# Patient Record
Sex: Female | Born: 1950
Health system: Southern US, Community
[De-identification: ages and names within clinical notes are randomized; demographics above are authoritative.]

## PROBLEM LIST (undated history)

## (undated) DIAGNOSIS — R519 Headache, unspecified: Secondary | ICD-10-CM

## (undated) DIAGNOSIS — J45909 Unspecified asthma, uncomplicated: Secondary | ICD-10-CM

## (undated) DIAGNOSIS — M810 Age-related osteoporosis without current pathological fracture: Secondary | ICD-10-CM

## (undated) DIAGNOSIS — E079 Disorder of thyroid, unspecified: Secondary | ICD-10-CM

## (undated) DIAGNOSIS — F419 Anxiety disorder, unspecified: Secondary | ICD-10-CM

## (undated) DIAGNOSIS — I4891 Unspecified atrial fibrillation: Secondary | ICD-10-CM

## (undated) DIAGNOSIS — M1990 Unspecified osteoarthritis, unspecified site: Secondary | ICD-10-CM

## (undated) DIAGNOSIS — C801 Malignant (primary) neoplasm, unspecified: Secondary | ICD-10-CM

## (undated) DIAGNOSIS — F5101 Primary insomnia: Secondary | ICD-10-CM

## (undated) DIAGNOSIS — K5900 Constipation, unspecified: Secondary | ICD-10-CM

## (undated) DIAGNOSIS — F32A Depression, unspecified: Secondary | ICD-10-CM

## (undated) HISTORY — DX: Malignant (primary) neoplasm, unspecified: C80.1

## (undated) HISTORY — DX: Constipation, unspecified: K59.00

## (undated) HISTORY — DX: Unspecified atrial fibrillation: I48.91

## (undated) HISTORY — DX: Disorder of thyroid, unspecified: E07.9

## (undated) HISTORY — DX: Age-related osteoporosis without current pathological fracture: M81.0

## (undated) HISTORY — DX: Primary insomnia: F51.01

## (undated) HISTORY — DX: Unspecified osteoarthritis, unspecified site: M19.90

## (undated) HISTORY — PX: PR ANES; REPAIR OF RECTOCELE: AN45560

## (undated) HISTORY — DX: Unspecified asthma, uncomplicated: J45.909

## (undated) HISTORY — DX: Headache, unspecified: R51.9

## (undated) HISTORY — DX: Depression, unspecified: F32.A

## (undated) HISTORY — DX: Anxiety disorder, unspecified: F41.9

---

## 2017-04-30 ENCOUNTER — Encounter (INDEPENDENT_AMBULATORY_CARE_PROVIDER_SITE_OTHER): Payer: Self-pay | Admitting: Urology

## 2017-04-30 ENCOUNTER — Ambulatory Visit (INDEPENDENT_AMBULATORY_CARE_PROVIDER_SITE_OTHER): Payer: Medicare HMO | Admitting: Urology

## 2017-04-30 VITALS — BP 120/76 | Ht 64.0 in | Wt 240.0 lb

## 2017-04-30 DIAGNOSIS — Z6841 Body Mass Index (BMI) 40.0 and over, adult: Secondary | ICD-10-CM

## 2017-04-30 DIAGNOSIS — N952 Postmenopausal atrophic vaginitis: Secondary | ICD-10-CM

## 2017-04-30 DIAGNOSIS — R351 Nocturia: Secondary | ICD-10-CM | POA: Insufficient documentation

## 2017-04-30 DIAGNOSIS — K649 Unspecified hemorrhoids: Secondary | ICD-10-CM | POA: Insufficient documentation

## 2017-04-30 DIAGNOSIS — N3941 Urge incontinence: Secondary | ICD-10-CM

## 2017-04-30 DIAGNOSIS — R3915 Urgency of urination: Secondary | ICD-10-CM

## 2017-04-30 LAB — PR U/A NONAUTO W/MICRO, ONSITE
Specific Gravity, URN: 1.03 (ref 1.005–1.030)
pH, URN (UWNC): 5 (ref 5.0–8.0)

## 2017-04-30 LAB — PR MEAS POST-VOIDING RESIDUAL URINE&/BLADDER CAP

## 2017-04-30 MED ORDER — ESTRADIOL 10 MCG VA TABS
10.0000 ug | ORAL_TABLET | VAGINAL | 12 refills | Status: AC
Start: 2017-05-01 — End: ?

## 2017-04-30 NOTE — Patient Instructions (Addendum)
1.  Handout given for dietary modification for low acid diet  2.  Kegel program: Slow twitch fibers: Squeeze and hold 5 seconds, relax for 10 seconds, this is one rep, do to 10-20 reps 3-4 times per week.  Fast twitch fibers: Squeeze 6 or 8 times quickly, then relax for 10 seconds, this is one rep, do 5-10 reps 3 or 4 times per week  3.  Prescription sent for estrogen vaginal suppositories to be used twice weekly  4.  Consider seeing Dr. Jacqlyn Krauss or Dr. Wendie Agreste at [facility] general surgery for evaluation of hemorrhoids  5.  Follow-up in 3 months for reevaluation

## 2017-04-30 NOTE — Progress Notes (Signed)
[place], [place]  [email or web address]  _________________________________________________________    Patient: Mary Camacho  DOB: July 09, 1950 (66 year old)  Date of Visit: 04/30/2017    CC:  Incontinence     SUBJECTIVE:  Patient's old records are unavailable, but she reports that in 1999 she had a bladder neck suspension and an anterior posterior repair by myself.  In January 2000 she had repeat surgery for removal of the stitch.    For about 5 years she has required splinting her perineum to have a bowel movement.  This year she has noted a vaginal bulge and occasional episodes of bowel urgency.  She has a stinging and cracking sensation around her anus that significantly worsens with urination.  She had an antibiotic for a bladder infection which slightly help the symptoms of anal itching but they returned.    Current symptoms include significant urinary urgency incontinence, frequency every 2 hours and nocturia 3-4 times per night.  She has only mild stress incontinence.  She is status post hysterectomy.      I reviewed the patient's recorded medical history, and confirmed the medications, problem list, allergies, past medical history, past surgical history, social history and family history with the patient.     REVIEW OF SYSTEMS:  A 14 system ROS was reviewed and scanned into the chart, see Media tab. Positive for sexual problems, dysuria, incontinence requiring 2 pads, urgency, frequency, nocturia 3-4, vaginal itching, constipation, trouble swallowing, rectal stinging and bleeding, weight gain about 30-50 pounds a year, fatigue, irregular heartbeat with SVT/A. fib, cold intolerance, dry eyes, hearing loss, sleep apnea, shortness of breath, asthma, arthritis, joint pain, headaches, tremor, anxiety, depression, sleep problems and all other systems negative    OBJECTIVE:  Vitals: BP 120/76    Ht 5\' 4"  (1.626 m)    Wt (!) 240 lb (108.9 kg)     BMI 41.20 kg/m   General: healthy, alert, overweight, no distress  Abdomen:  soft, nontender, without masses, hernias, inguinal adenopathy, SP tenderness.  GU:   Vaginal Exam  External genitalia is moderately atrophic. Valsalva incontinence was not present. Urethral tenderness was not present. Urethral hypermobility was present. Vaginal masses were not present. Vaginal lesions were not present. Vaginal discharge was not present.    Vaginal length:  8 cm with 1 cm vault prolapse and 1 cm anterior wall prolapse with straining    Cystocele: grade I  Rectocele: none  Enterocele: grade I  Uterine prolapse: surgically absent  Bladder: normal    Pelvic floor:   Levators: normal. Patient with weak Kegel.    Rectal exam:  Anal tone normal. Masses were not appreciated. There was no blood. There were hemorrhoids.  Rectocele: small   Anal sphincter attenuation: moderate      ASSESSMENT:  (N39.41) Urge incontinence  (primary encounter diagnosis)  Plan: U/A NONAUTO W/MICRO, ONSITE, MEAS POST-VOIDING         RESIDUAL URINE&/BLADDER CAP        Patient was reassured that her bladder emptied well and she had no evidence of urinary tract infection.  She does not have much rectal prolapse and has only mild anterior wall and vaginal apex prolapse, not enough to consider surgery.  For her overactive bladder symptoms, discussed oral medication, topical vaginal estrogen, dietary modification and Kegel exercises.  Patient would like to try everything except oral medication.  Handout given for low acid diet and discussed.  Kegel program written.  Follow-up in 3 months for reevaluation    (R39.15) Urinary urgency      (R35.1) Nocturia      (N95.2) Atrophic vaginitis  Plan: Estradiol 10 MCG Vaginal Tab        Patient opted for Vagifem for topical vaginal estrogen.  Side effects discussed, prescription sent.      (K64.9) Hemorrhoids, unspecified hemorrhoid type  Plan: Patient's hemorrhoids may be the etiology of the itching around her anus,  have suggested that she follow up with a colorectal surgeon or general surgeon for evaluation.            Irven Easterly, MD

## 2017-05-07 ENCOUNTER — Encounter (INDEPENDENT_AMBULATORY_CARE_PROVIDER_SITE_OTHER): Payer: Medicare HMO | Admitting: Urology

## 2017-05-08 ENCOUNTER — Telehealth (INDEPENDENT_AMBULATORY_CARE_PROVIDER_SITE_OTHER): Payer: Self-pay | Admitting: Urology

## 2017-05-08 NOTE — Telephone Encounter (Signed)
Mary Camacho called to follow up on from her 12/19 appointment. She feels that there were two issues that need to be addressed further, and would also like to note that it was because she may not have communicated these issues properly. She would like to note that she is very satisfied with SW's care, she is just seeking answers to the following issues:    1) Mary Camacho had a UTI a while back, but she is still experiencing pain in the tube when she urine is coming down. She reports that this was discussed at her visit, but she would like to know what else to do.    2) Mary Camacho has a skin irritation in the pubic hair on the left side. She would like to know what to do about it.

## 2017-05-08 NOTE — Telephone Encounter (Signed)
Please call patient regarding estrogen cream, she was given the vaginal tabs, states she needs to put the cream on sore in vaginal area, I did read her the note where hydrocortisone was suggested.  Please advise patient on Friday 05/09/2017 in the morning

## 2017-05-08 NOTE — Telephone Encounter (Signed)
This RN returned patients call re: 2 follow up questions after her 04/30/17 appointment with SW. Advised patient to start Vagifem estrogen cream and see if that helps resolve the irritation she is experiencing with urination. For the skin irritation in her pubic area, advised topical hyrdrocortisone cream. Patient will call back as needed to follow up with status of these issues. Florina Ou, RN

## 2017-05-09 NOTE — Telephone Encounter (Signed)
Reassured patient that Estradiol Vaginal tabs will help decrease burning and itching as well. Patient satisfied with clarification. Florina Ou, RN

## 2017-07-30 ENCOUNTER — Encounter (INDEPENDENT_AMBULATORY_CARE_PROVIDER_SITE_OTHER): Payer: Medicare HMO | Admitting: Urology

## 2017-08-13 ENCOUNTER — Ambulatory Visit (INDEPENDENT_AMBULATORY_CARE_PROVIDER_SITE_OTHER): Payer: Medicare HMO | Admitting: Urology

## 2017-08-13 VITALS — BP 108/60 | Ht 64.0 in | Wt 232.0 lb

## 2017-08-13 DIAGNOSIS — Z6839 Body mass index (BMI) 39.0-39.9, adult: Secondary | ICD-10-CM

## 2017-08-13 DIAGNOSIS — R3915 Urgency of urination: Secondary | ICD-10-CM

## 2017-08-13 DIAGNOSIS — N952 Postmenopausal atrophic vaginitis: Secondary | ICD-10-CM

## 2017-08-13 DIAGNOSIS — R350 Frequency of micturition: Secondary | ICD-10-CM | POA: Insufficient documentation

## 2017-08-13 DIAGNOSIS — R351 Nocturia: Secondary | ICD-10-CM

## 2017-08-13 DIAGNOSIS — N3941 Urge incontinence: Secondary | ICD-10-CM

## 2017-08-13 NOTE — Progress Notes (Signed)
[place], [place]  [email or web address]  _________________________________________________________    Patient: Mary Camacho  DOB: [birth year] (67 year old)  Date of Visit: 08/13/2017    CC:  Incontinence     SUBJECTIVE:  she reports that in 1999 she had a bladder neck suspension and an anterior posterior repair by myself.  In January 2000 she had repeat surgery for removal of the stitch.    For about 5 years she has required splinting her perineum to have a bowel movement.  This year she has noted a vaginal bulge and occasional episodes of bowel urgency.  She has a stinging and cracking sensation around her anus that significantly worsens with urination.  She was found to have moderate hemorrhoids and follow-up was recommended with a general or colorectal surgeon.    On initial evaluation in December 2018 patient's exam showed minimal rectocele and only mild anterior wall and vault prolapse.  She did not have a UTI and her bladder emptied well.  She had symptoms of urgency, frequency, nocturia and urge incontinence.    For her overactive bladder symptoms she opted for topical vaginal estrogen, dietary modification and Kegel exercises.    Now: Patient reports that the vaginal sores improved with estrogen.  She was then hospitalized in January for congestive heart failure and did not use the estrogen for a while.  The vaginal sores recurred but have improved since she began using the vaginal estrogen again.  Kegel exercises did not help her symptoms.  She did not try dietary modification.    Since she was hospitalized she was started on 2 diuretics which have made her bladder symptoms much worse with urinary urgency, frequency and urge incontinence.  She currently wears 2 pads daily for the problem.        I reviewed the patient's recorded medical history, and confirmed the medications, problem list, allergies and past medical history with the  patient.     REVIEW OF SYSTEMS:  A 14 system ROS was reviewed and scanned into the chart, see Media tab.  Positive for incontinence requiring 2 pads daily, urgency and frequency due to diuretics, irregular heartbeat, dry eyes, hearing loss, varicose veins, mild easy bruising, shortness of breath, asthma, chronic pain, muscle weakness, osteoarthritis, muscle cramping, joint pain, headaches, tremor, anxiety, depression, sexual problems, sleep problems and all other systems negative.    OBJECTIVE:  Vitals: BP 108/60    Ht 5\' 4"  (1.626 m)    Wt (!) 232 lb (105.2 kg)    BMI 39.82 kg/m   General: alert, chronically ill appearing, overweight, no distress      ASSESSMENT:  (N39.41) Urge incontinence  (primary encounter diagnosis)  Plan: Discussed options for OAB for patient, she is not willing to try further medication at this point but did not try dietary modification and would like to have another copy of the low acid diet.  Handout given and discussed.  In the future, patient could consider anticholinergic medication if she wishes.  Follow-up as needed for further urologic problems.    (R35.1) Nocturia      (R39.15) Urinary urgency      (R35.0) Frequency of micturition      (N95.2) Atrophic vaginitis  Plan: Have encouraged patient to continue using topical vaginal estrogen indefinitely.            Irven Easterly, MD

## 2017-08-13 NOTE — Patient Instructions (Signed)
1.  Handout given for dietary modification for low acid diet  2.  Continue topical vaginal estrogen indefinitely, check to see if your primary care physician will refill this prescription in the future.  Current prescription is good until December 2019  4.  Consider seeing Dr. Jacqlyn Krauss or Dr. Wendie Agreste at [facility] general surgery for evaluation of hemorrhoids  5.  Follow-up as needed for further urologic problems

## 2017-10-17 ENCOUNTER — Telehealth (INDEPENDENT_AMBULATORY_CARE_PROVIDER_SITE_OTHER): Payer: Self-pay | Admitting: Urology

## 2017-10-17 NOTE — Telephone Encounter (Signed)
Mary Camacho called in regards to potentially seeking a referral for a "sore in vaginal skin area where the skin is getting very thin."    She states that SW had prescribed Estradiol to make the skin thicker, but it has not been making it thicker, it is still very thin and very sore. Mary Camacho would not like to make an appointment with SW for follow up from this Rx as she does not see the point unless something new will be done.    Mary Camacho is curious if SW has a recommendation for a referral to another provider who could provide further treatment for her.

## 2017-10-20 NOTE — Telephone Encounter (Signed)
She could consider seeing a gynecologist or a dermatologist for the symptoms.

## 2017-10-20 NOTE — Telephone Encounter (Signed)
I called Mary Camacho back and let her know. She has a dermatology already and she will call him and see if he does that. Or her or her PCP can refer her to a gynecologist.

## 2017-11-05 ENCOUNTER — Telehealth (INDEPENDENT_AMBULATORY_CARE_PROVIDER_SITE_OTHER): Payer: Self-pay | Admitting: Urology

## 2017-11-05 NOTE — Telephone Encounter (Signed)
Faxed visit notes from Epic (2 visit notes) to [facility] per STAT request received 6/26.    Fax: [phone]

## 2017-11-23 ENCOUNTER — Inpatient Hospital Stay: Payer: Self-pay

## 2017-11-25 ENCOUNTER — Inpatient Hospital Stay: Payer: Self-pay

## 2017-12-04 ENCOUNTER — Emergency Department (HOSPITAL_COMMUNITY): Payer: Medicare Other

## 2017-12-04 ENCOUNTER — Emergency Department (HOSPITAL_COMMUNITY)
Admission: EM | Admit: 2017-12-04 | Discharge: 2017-12-04 | Disposition: A | Discharge status: Home / Self Care | Payer: Medicare Other | Attending: Emergency Medicine | Admitting: Emergency Medicine

## 2017-12-04 DIAGNOSIS — M25551 Pain in right hip: Secondary | ICD-10-CM | POA: Diagnosis not present

## 2017-12-04 DIAGNOSIS — M791 Myalgia, unspecified site: Secondary | ICD-10-CM | POA: Diagnosis not present

## 2017-12-04 DIAGNOSIS — W010XXA Fall on same level from slipping, tripping and stumbling without subsequent striking against object, initial encounter: Secondary | ICD-10-CM | POA: Insufficient documentation

## 2017-12-04 DIAGNOSIS — Z79899 Other long term (current) drug therapy: Secondary | ICD-10-CM | POA: Insufficient documentation

## 2017-12-04 DIAGNOSIS — Y999 Unspecified external cause status: Secondary | ICD-10-CM | POA: Insufficient documentation

## 2017-12-04 DIAGNOSIS — R51 Headache: Secondary | ICD-10-CM | POA: Diagnosis not present

## 2017-12-04 DIAGNOSIS — Z7901 Long term (current) use of anticoagulants: Secondary | ICD-10-CM | POA: Insufficient documentation

## 2017-12-04 DIAGNOSIS — Y9319 Activity, other involving water and watercraft: Secondary | ICD-10-CM | POA: Diagnosis not present

## 2017-12-04 DIAGNOSIS — M62838 Other muscle spasm: Secondary | ICD-10-CM | POA: Diagnosis not present

## 2017-12-04 DIAGNOSIS — I10 Essential (primary) hypertension: Secondary | ICD-10-CM | POA: Diagnosis not present

## 2017-12-04 DIAGNOSIS — W19XXXA Unspecified fall, initial encounter: Secondary | ICD-10-CM

## 2017-12-04 DIAGNOSIS — Y9289 Other specified places as the place of occurrence of the external cause: Secondary | ICD-10-CM | POA: Insufficient documentation

## 2017-12-04 LAB — BASIC METABOLIC PANEL
ANION GAP: 14 (ref 5–15)
BUN: 23 mg/dL (ref 8–23)
CO2: 31 mmol/L (ref 22–32)
Calcium: 9.8 mg/dL (ref 8.9–10.3)
Chloride: 96 mmol/L — ABNORMAL LOW (ref 98–111)
Creatinine, Ser: 1.18 mg/dL — ABNORMAL HIGH (ref 0.44–1.00)
GFR, EST AFRICAN AMERICAN: 54 mL/min — AB (ref >60)
GFR, EST NON AFRICAN AMERICAN: 46 mL/min — AB (ref >60)
GLUCOSE: 122 mg/dL — AB (ref 70–99)
POTASSIUM: 4.2 mmol/L (ref 3.5–5.1)
SODIUM: 141 mmol/L (ref 135–145)

## 2017-12-04 LAB — PROTIME-INR
INR: 1.34
PROTHROMBIN TIME: 16.5 s — AB (ref 11.4–15.2)

## 2017-12-04 LAB — CBC
HCT: 39.3 % (ref 36.0–46.0)
Hemoglobin: 12.3 g/dL (ref 12.0–15.0)
MCH: 30.4 pg (ref 26.0–34.0)
MCHC: 31.3 g/dL (ref 30.0–36.0)
MCV: 97.3 fL (ref 78.0–100.0)
Platelets: 317 10*3/uL (ref 150–400)
RBC: 4.04 MIL/uL (ref 3.87–5.11)
RDW: 16.1 % — ABNORMAL HIGH (ref 11.5–15.5)
WBC: 12.3 10*3/uL — AB (ref 4.0–10.5)

## 2017-12-04 MED ORDER — MORPHINE SULFATE (PF) 4 MG/ML IV SOLN
2.0000 mg | Freq: Once | INTRAVENOUS | Status: AC
  Administered 2017-12-04: 2 mg via INTRAVENOUS

## 2017-12-04 MED ORDER — ONDANSETRON 4 MG PO TBDP: 4.0000 mg | ORAL_TABLET | Freq: Three times a day (TID) | ORAL | 0 refills | Status: AC | PRN

## 2017-12-04 MED ORDER — KETOROLAC TROMETHAMINE 15 MG/ML IJ SOLN
15.0000 mg | Freq: Once | INTRAMUSCULAR | Status: AC
  Administered 2017-12-04: 15 mg via INTRAVENOUS
  Filled 2017-12-04: qty 1

## 2017-12-04 MED ORDER — HYDROCODONE-ACETAMINOPHEN 5-325 MG PO TABS: 1.0000 | ORAL_TABLET | Freq: Four times a day (QID) | ORAL | 0 refills | Status: AC | PRN

## 2017-12-04 MED ORDER — SODIUM CHLORIDE 0.9 % IV BOLUS
1000.0000 mL | Freq: Once | INTRAVENOUS | Status: AC
  Administered 2017-12-04: 1000 mL via INTRAVENOUS

## 2017-12-04 MED ORDER — ONDANSETRON HCL 4 MG/2ML IJ SOLN
4.0000 mg | Freq: Once | INTRAMUSCULAR | Status: AC
  Administered 2017-12-04: 4 mg via INTRAVENOUS
  Filled 2017-12-04: qty 2

## 2017-12-04 MED ORDER — MORPHINE SULFATE (PF) 4 MG/ML IV SOLN
4.0000 mg | Freq: Once | INTRAVENOUS | Status: DC
  Filled 2017-12-04: qty 1

## 2017-12-04 MED ORDER — LORAZEPAM 2 MG/ML IJ SOLN
1.0000 mg | Freq: Once | INTRAMUSCULAR | Status: AC
  Administered 2017-12-04: 1 mg via INTRAVENOUS
  Filled 2017-12-04: qty 1

## 2017-12-04 NOTE — ED Notes (Signed)
Pt walks in hall with assistance from walker. Pt remains on O2 via Lane @ 3LPM. Pt walks to bathroom and back without incident. Pt appears steady on feet.

## 2017-12-04 NOTE — ED Triage Notes (Addendum)
Pt arrived via [facility] with C-collar in place. [place] [facility]ip and [facility] in [facility] at [employer] [facility]. C/o lower right back pain. Pt struck tailbone and back of head. Pt on blood eliquis. Pt is alert and oriented x4 at time of triage.

## 2017-12-04 NOTE — ED Notes (Signed)
Patient transported to CT 

## 2017-12-04 NOTE — ED Provider Notes (Signed)
[facility] Provider Note   CSN: [id number] Arrival date & time: 12/04/17  1242     History   Chief Complaint No chief complaint on file.   HPI Samantha Burton is a 67 y.o. female.  HPI 67 year old female with past medical history of A. fib on Eliquis here with fall.  The patient is currently visiting from [place].  She is here for a family reunion.  Patient was at a water park when she slipped.  She fell backwards, striking her right hip and buttocks on the ground, then falling backwards and hitting her head neck.  There is no loss of consciousness.  Patient currently endorses 8 out of 10 aching, throbbing, right hip and buttocks pain.  Denies any distal numbness or weakness.  She does not believe she lost consciousness.  She denies any overt neck pain.  Denies any shoulder or chest pain.  She was well prior to the fall.  Pain is worse with any kind of movement or palpation.  She has not attempted to ambulate.  Denies any alleviating factors.  No past medical history on file.   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      OB History   None      Home Medications    Prior to Admission medications   Medication Sig Start Date End Date Taking? Authorizing Provider  acetaminophen (TYLENOL) 500 MG tablet Take 1,000 mg by mouth every 6 (six) hours as needed for mild pain or headache.   Yes [provider]  albuterol (PROVENTIL HFA) 108 (90 Base) MCG/ACT inhaler Inhale 2 puffs into the lungs 2 (two) times daily. 11/09/09  Yes [provider]  apixaban (ELIQUIS) 5 MG TABS tablet Take 5 mg by mouth 2 (two) times daily. 02/28/16  Yes [provider]  B Complex-C-E-Zn (B COMPLEX-C-E-ZINC) tablet Take 1 tablet by mouth daily.   Yes [provider]  bumetanide  (BUMEX) 2 MG tablet Take 4 mg by mouth daily.   Yes [provider]  cefdinir (OMNICEF) 300 MG capsule Take 300 mg by mouth 2 (two) times daily. 11/28/17 12/08/17 Yes [provider]  cholecalciferol (VITAMIN D) 1000 units tablet Take 1,000 Units by mouth daily.   Yes [provider]  digoxin (LANOXIN) 0.125 MG tablet Take 0.125 mg by mouth daily.   Yes [provider]  diltiazem (TIAZAC) 240 MG 24 hr capsule Take 240 mg by mouth daily.   Yes [provider]  escitalopram (LEXAPRO) 20 MG tablet Take 20 mg by mouth daily.   Yes [provider]  Estradiol 10 MCG TABS vaginal tablet Place 1 tablet vaginally 2 (two) times a week. 05/01/17  Yes [provider]  fluticasone (FLONASE) 50 MCG/ACT nasal spray Place 2 sprays into both nostrils as needed for allergies or rhinitis.   Yes [provider]  Fluticasone-Salmeterol (ADVAIR) 500-50 MCG/DOSE AEPB Inhale 1 puff into the lungs 2 (two) times daily.   Yes [provider]  Hypromellose (GENTEAL MILD) 0.2 % SOLN Apply 1 drop to eye as needed (dry eyes).   Yes [provider]  levalbuterol (XOPENEX) 1.25 MG/3ML nebulizer solution Inhale 3 mLs into the lungs every 4 (four) hours as needed for wheezing or shortness of breath. 05/30/17  Yes [provider]  levothyroxine (SYNTHROID, LEVOTHROID) 150 MCG tablet  Take 150 mcg by mouth daily before breakfast.   Yes [provider]  montelukast (SINGULAIR) 10 MG tablet Take 10 mg by mouth at bedtime.   Yes [provider]  Omega-3 Fatty Acids (FISH OIL) 1000 MG CAPS Take 1,000 mg by mouth daily.   Yes [provider]  OXYGEN Inhale 3 L into the lungs.   Yes [provider]  pregabalin (LYRICA) 75 MG capsule Take 75 mg by mouth 2 (two) times daily.   Yes [provider]  primidone (MYSOLINE) 50 MG tablet Take 50 mg by mouth 4 (four) times daily.   Yes [provider]    spironolactone (ALDACTONE) 25 MG tablet Take 25 mg by mouth daily.   Yes [provider]  vitamin A 16109 UNIT capsule Take 10,000 Units by mouth daily.   Yes [provider]  Xylitol (XYLIMELTS) 550 MG DISK Use as directed 1 tablet in the mouth or throat as needed (dry mouth).   Yes [provider]  HYDROcodone-acetaminophen (NORCO/VICODIN) 5-325 MG tablet Take 1-2 tablets by mouth every 6 (six) hours as needed for moderate pain or severe pain. 12/04/17   Shaune Pollack, MD  ondansetron (ZOFRAN ODT) 4 MG disintegrating tablet Take 1 tablet (4 mg total) by mouth every 8 (eight) hours as needed for nausea or vomiting. 12/04/17   Shaune Pollack, MD    Family History No family history on file.  Social History Social History   Tobacco Use  . Smoking status: Not on file  Substance Use Topics  . Alcohol use: Not on file  . Drug use: Not on file     Allergies   Metoprolol; Caffeine; Demerol [meperidine]; Fluvastatin; Lescol [fluvastatin sodium]; and Levofloxacin   Review of Systems Review of Systems  Constitutional: Negative for chills, fatigue and fever.  HENT: Negative for congestion and rhinorrhea.   Eyes: Negative for visual disturbance.  Respiratory: Negative for cough, shortness of breath and wheezing.   Cardiovascular: Negative for chest pain and leg swelling.  Gastrointestinal: Negative for abdominal pain, diarrhea, nausea and vomiting.  Genitourinary: Negative for dysuria and flank pain.  Musculoskeletal: Positive for arthralgias, gait problem and myalgias. Negative for neck pain and neck stiffness.  Skin: Negative for rash and wound.  Allergic/Immunologic: Negative for immunocompromised state.  Neurological: Positive for headaches. Negative for syncope and weakness.  All other systems reviewed and are negative.    Physical Exam Updated Vital Signs BP 111/69   Pulse 72   SpO2 91%   Physical Exam  Constitutional: She is oriented to  person, place, and time. She appears well-developed and well-nourished. No distress.  HENT:  Head: Normocephalic and atraumatic.  No apparent significant head or neck trauma.  No dental trauma.  No bruising around the eyes or ears.  Eyes: Conjunctivae are normal.  Neck: Neck supple.  No midline or paraspinal tenderness.  Cardiovascular: Normal rate, regular rhythm and normal heart sounds. Exam reveals no friction rub.  No murmur heard. Pulmonary/Chest: Effort normal and breath sounds normal. No respiratory distress. She has no wheezes. She has no rales.  Abdominal: Soft. Bowel sounds are normal. She exhibits no distension. There is no tenderness. There is no rebound and no guarding.  Musculoskeletal: She exhibits tenderness. She exhibits no edema.  Tenderness to palpation over the right SI joint and lateral hip.  No bruising or ecchymoses.  Neurological: She is alert and oriented to person, place, and time. She exhibits normal muscle tone.  Distal strength 5 out  of 5 bilateral lower extremities.  Normal sensation light touch.  Reflexes 2+ and symmetric bilateral lower and upper extremities.  Skin: Skin is warm. Capillary refill takes less than 2 seconds.  Psychiatric: She has a normal mood and affect.  Nursing note and vitals reviewed.    ED Treatments / Results  Labs (all labs ordered are listed, but only abnormal results are displayed) Labs Reviewed  CBC - Abnormal; Notable for the following components:      Result Value   WBC 12.3 (*)    RDW 16.1 (*)    All other components within normal limits  BASIC METABOLIC PANEL - Abnormal; Notable for the following components:   Chloride 96 (*)    Glucose, Bld 122 (*)    Creatinine, Ser 1.18 (*)    GFR calc non Af Amer 46 (*)    GFR calc Af Amer 54 (*)    All other components within normal limits  PROTIME-INR - Abnormal; Notable for the following components:   Prothrombin Time 16.5 (*)    All other components within normal limits     EKG None  Radiology Dg Lumbar Spine Complete  Result Date: 12/04/2017 CLINICAL DATA:  Fall.  Back pain. EXAM: LUMBAR SPINE - COMPLETE 4+ VIEW COMPARISON:  No recent. FINDINGS: Lumbar scoliosis concave left. Diffuse severe multilevel degenerative change. No acute bony abnormality. No evidence of fracture. IMPRESSION: Lumbar scoliosis concave left. Diffuse severe multilevel degenerative change. No acute bony abnormality. Electronically Signed   By: Maisie Fus  Register   On: 12/04/2017 13:52   Ct Head Wo Contrast  Result Date: 12/04/2017 CLINICAL DATA:  Fall, on blood thinners EXAM: CT HEAD WITHOUT CONTRAST CT CERVICAL SPINE WITHOUT CONTRAST TECHNIQUE: Multidetector CT imaging of the head and cervical spine was performed following the standard protocol without intravenous contrast. Multiplanar CT image reconstructions of the cervical spine were also generated. COMPARISON:  None. FINDINGS: CT HEAD FINDINGS Brain: No acute intracranial abnormality. Specifically, no hemorrhage, hydrocephalus, mass lesion, acute infarction, or significant intracranial injury. Vascular: No hyperdense vessel or unexpected calcification. Skull: No acute calvarial abnormality. Sinuses/Orbits: Visualized paranasal sinuses and mastoids clear. Orbital soft tissues unremarkable. Other: None CT CERVICAL SPINE FINDINGS Alignment: Normal alignment. Skull base and vertebrae: No acute fracture. No primary bone lesion or focal pathologic process. Soft tissues and spinal canal: No prevertebral fluid or swelling. No visible canal hematoma. Disc levels: Diffuse degenerative disc disease with disc space narrowing and spurring. Mild diffuse degenerative facet disease, left greater than right. Upper chest: No acute findings Other: No acute findings IMPRESSION: No acute intracranial abnormality. Degenerative changes in the cervical spine. No acute bony abnormality. Electronically Signed   By: Charlett Nose M.D.   On: 12/04/2017 13:28   Ct  Cervical Spine Wo Contrast  Result Date: 12/04/2017 CLINICAL DATA:  Fall, on blood thinners EXAM: CT HEAD WITHOUT CONTRAST CT CERVICAL SPINE WITHOUT CONTRAST TECHNIQUE: Multidetector CT imaging of the head and cervical spine was performed following the standard protocol without intravenous contrast. Multiplanar CT image reconstructions of the cervical spine were also generated. COMPARISON:  None. FINDINGS: CT HEAD FINDINGS Brain: No acute intracranial abnormality. Specifically, no hemorrhage, hydrocephalus, mass lesion, acute infarction, or significant intracranial injury. Vascular: No hyperdense vessel or unexpected calcification. Skull: No acute calvarial abnormality. Sinuses/Orbits: Visualized paranasal sinuses and mastoids clear. Orbital soft tissues unremarkable. Other: None CT CERVICAL SPINE FINDINGS Alignment: Normal alignment. Skull base and vertebrae: No acute fracture. No primary bone lesion or focal pathologic process. Soft tissues and  spinal canal: No prevertebral fluid or swelling. No visible canal hematoma. Disc levels: Diffuse degenerative disc disease with disc space narrowing and spurring. Mild diffuse degenerative facet disease, left greater than right. Upper chest: No acute findings Other: No acute findings IMPRESSION: No acute intracranial abnormality. Degenerative changes in the cervical spine. No acute bony abnormality. Electronically Signed   By: Charlett Nose M.D.   On: 12/04/2017 13:28   Dg Hip Unilat W Or Wo Pelvis 2-3 Views Right  Result Date: 12/04/2017 CLINICAL DATA:  Patient fell striking the tail bone in the PAC of the head. The patient has pain when lying flat. The patient reports lower right-sided back and tailbone pain. EXAM: DG HIP (WITH OR WITHOUT PELVIS) 2-3V RIGHT COMPARISON:  None. FINDINGS: The bony pelvis is subjectively adequately mineralized. There is no acute or healing fracture or lytic or blastic lesion. AP and lateral views of the right hip reveal mild symmetric  narrowing of the joint space. The articular surfaces of the femoral head and acetabulum remains smoothly rounded. The femoral neck, intertrochanteric, and subtrochanteric regions are normal. IMPRESSION: No acute fracture or dislocation of the right hip. Very mild symmetric joint space loss of the right hip is consistent with early osteoarthritic change. Electronically Signed   By: David  [place] M.D.   On: 12/04/2017 13:59    Procedures Procedures (including critical care time)  Medications Ordered in ED Medications  ondansetron (ZOFRAN) injection 4 mg (4 mg Intravenous Given 12/04/17 1503)  morphine 4 MG/ML injection 2 mg (2 mg Intravenous Given 12/04/17 1504)  ketorolac (TORADOL) 15 MG/ML injection 15 mg (15 mg Intravenous Given 12/04/17 1618)  sodium chloride 0.9 % bolus 1,000 mL (1,000 mLs Intravenous New Bag/Given 12/04/17 1632)  LORazepam (ATIVAN) injection 1 mg (1 mg Intravenous Given 12/04/17 1618)  sodium chloride 0.9 % bolus 1,000 mL (1,000 mLs Intravenous New Bag/Given 12/04/17 1633)     Initial Impression / Assessment and Plan / ED Course  I have reviewed the triage vital signs and the nursing notes.  Pertinent labs & imaging results that were available during my care of the patient were reviewed by me and considered in my medical decision making (see chart for details).     67 year old female here with multiple areas of pain after mechanical fall.  CT imaging negative.  Patient was well prior to the fall.  She does appear mildly dehydrated on lab work, which is likely due to being out in the heat all day.  Patient given fluids here.  She did have some spasm of her legs, which she states is usual for her when she is dehydrated and this resolved with fluids.  Patient otherwise well-appearing and at her baseline state of health/baseline O2 (h/o diaphragm paralysis). No new neuro deficits during period of obs in ED. Discussed delayed bleed, delaye dinjury precautions.  Will discharge  home.  Final Clinical Impressions(s) / ED Diagnoses   Final diagnoses:  Accident due to mechanical fall without injury, initial encounter  Right hip pain    ED Discharge Orders        Ordered    HYDROcodone-acetaminophen (NORCO/VICODIN) 5-325 MG tablet  Every 6 hours PRN     12/04/17 1758    ondansetron (ZOFRAN ODT) 4 MG disintegrating tablet  Every 8 hours PRN     12/04/17 Erick Blinks, MD 12/04/17 1800

## 2017-12-04 NOTE — ED Notes (Signed)
Pt discharged without complication or compliant. Instructions given to husband along with medication prescriptions. Pt taken to lobby in wheelchair.

## 2018-06-25 ENCOUNTER — Telehealth (HOSPITAL_BASED_OUTPATIENT_CLINIC_OR_DEPARTMENT_OTHER): Payer: Self-pay | Admitting: Neurology

## 2018-06-25 NOTE — Telephone Encounter (Signed)
Patient is again to update demographics and check status of referral.

## 2018-06-25 NOTE — Telephone Encounter (Signed)
RETURN CALL: Voicemail - Detailed Message      SUBJECT:  General Message     MESSAGE: Patient was referred for second opinion by Dr. Glenard Haring at [place]. Patient is unsure when referral was sent. No referral on file. [facility] attempted to warm transfer to clinic, no answer. Patient's existing neurologist is at [place]. She is looking for a second opinion with "someone who is an expert in diagnostics" and has no location preference. Please call back ASAP to confirm receipt of referral and review timeframe to schedule.

## 2018-06-26 NOTE — Telephone Encounter (Signed)
Contacted patient and her demographics is already updated, her referral is received, will contact her when it is ready to schedule

## 2018-07-06 ENCOUNTER — Inpatient Hospital Stay: Payer: Self-pay

## 2018-10-21 ENCOUNTER — Inpatient Hospital Stay: Payer: Self-pay

## 2019-01-17 ENCOUNTER — Inpatient Hospital Stay: Payer: Self-pay

## 2019-03-07 ENCOUNTER — Inpatient Hospital Stay: Payer: Self-pay

## 2019-06-29 ENCOUNTER — Inpatient Hospital Stay: Payer: Self-pay

## 2019-11-03 ENCOUNTER — Emergency Department: Payer: Self-pay

## 2019-11-04 ENCOUNTER — Emergency Department: Payer: Self-pay

## 2019-11-04 NOTE — Progress Notes (Signed)
TC Spoke with Darden Dates, Office RN of Dr Clint Bolder. They have pt Mary Camacho who has a positive COVID test and wants monoclonal antibody therapy.      PLAN:   TC provided contact # for [facility] Direct line [phone] or COVID Transfer info [phone].

## 2019-11-07 ENCOUNTER — Emergency Department: Payer: Self-pay

## 2019-11-08 ENCOUNTER — Inpatient Hospital Stay: Payer: Self-pay

## 2019-11-15 ENCOUNTER — Emergency Department: Payer: Self-pay

## 2020-01-02 ENCOUNTER — Emergency Department: Payer: Self-pay

## 2020-01-29 IMAGING — CT CT CERVICAL SPINE W/O CM
5 of 8 series · 12 of 33 positions shown, 13 images · non-contrast
Comparison: None.

CLINICAL DATA: Fall, on blood thinners

EXAM:
CT HEAD WITHOUT CONTRAST
CT CERVICAL SPINE WITHOUT CONTRAST
TECHNIQUE: Multidetector CT imaging of the head and cervical spine was
performed following the standard protocol without intravenous
contrast. Multiplanar CT image reconstructions of the cervical spine
were also generated.

[Series 5: head bone · axial · 0.44mm/px · z∈[-84,-24]mm · 2 of 92 slices shown]
[im 31/92  bone]
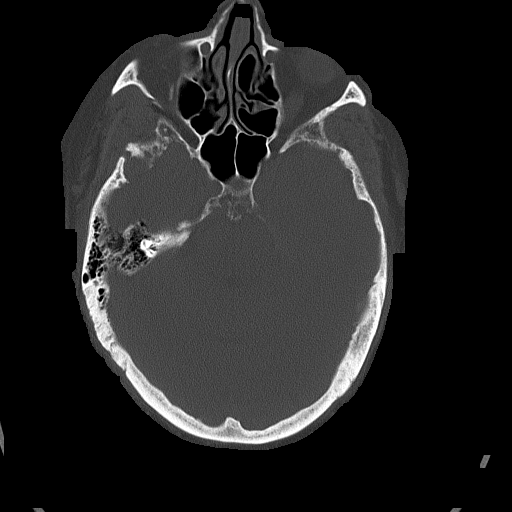
[im 61/92  bone]
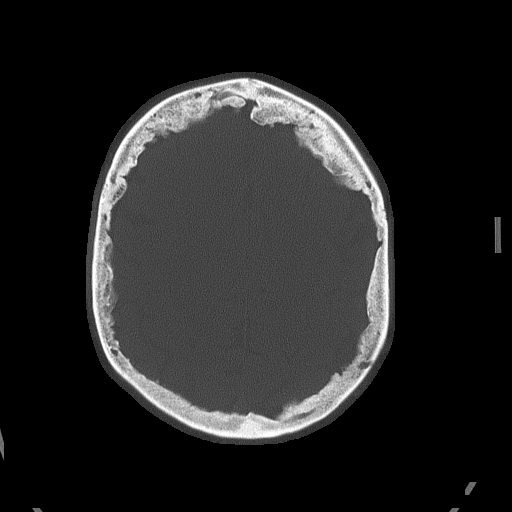

[Series 8: c_spine 2.0 st · axial · 0.30mm/px · z∈[-236,-170]mm · 2 of 101 slices shown, 3 images]
[im 34/101  soft-tissue]
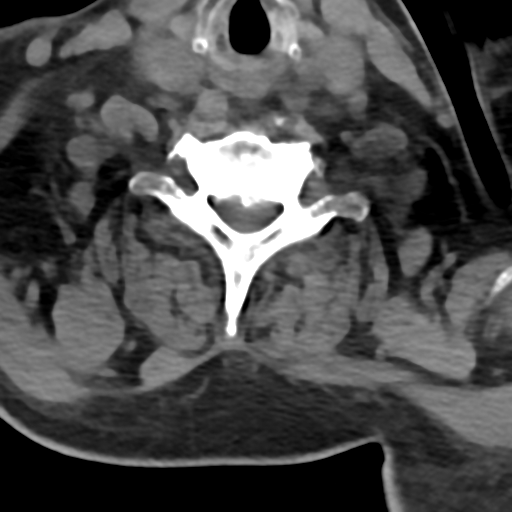
[im 34/101  bone]
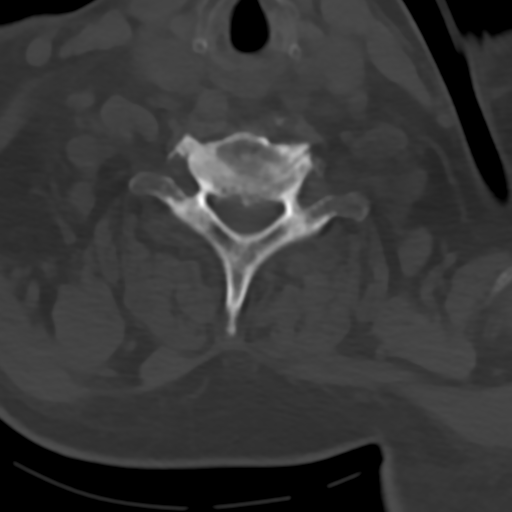
[im 67/101  bone]
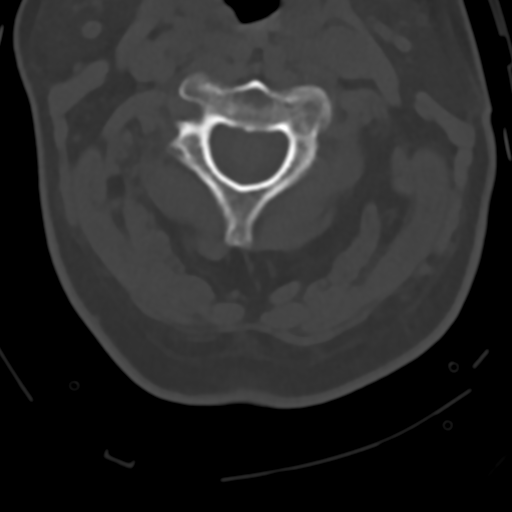

[Series 12: c_spine 2.0 sag bone · sagittal · 0.22mm/px · 4 of 61 slices shown]
[im 13/61  bone]
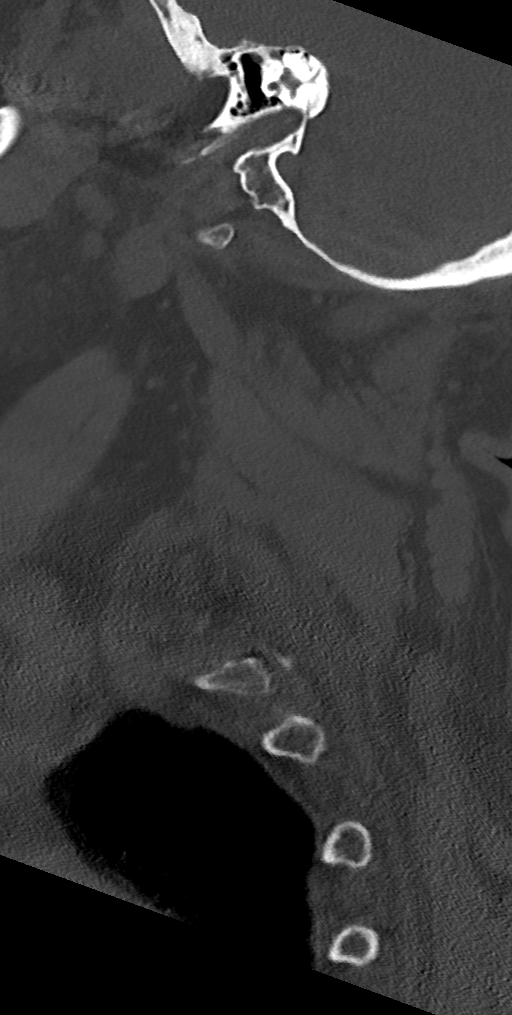
[im 25/61  bone]
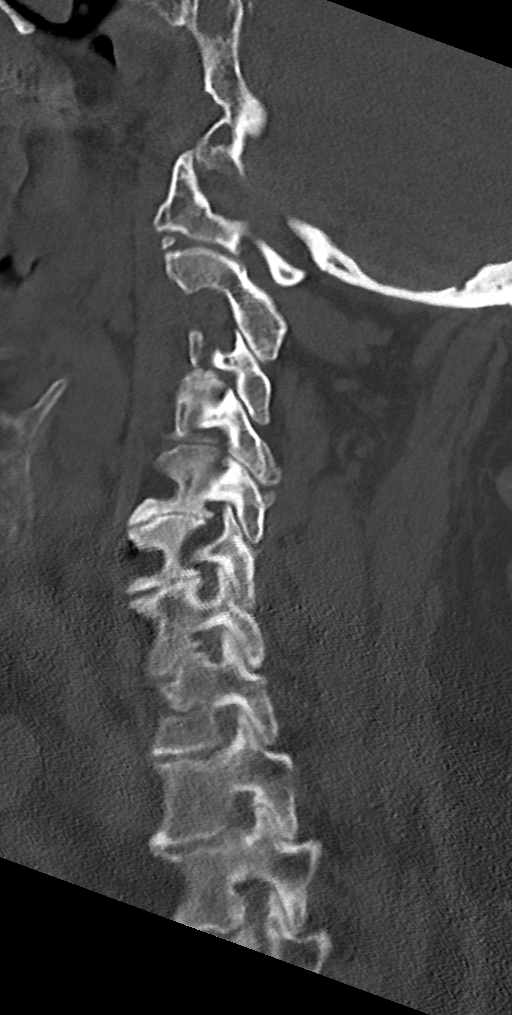
[im 37/61  bone]
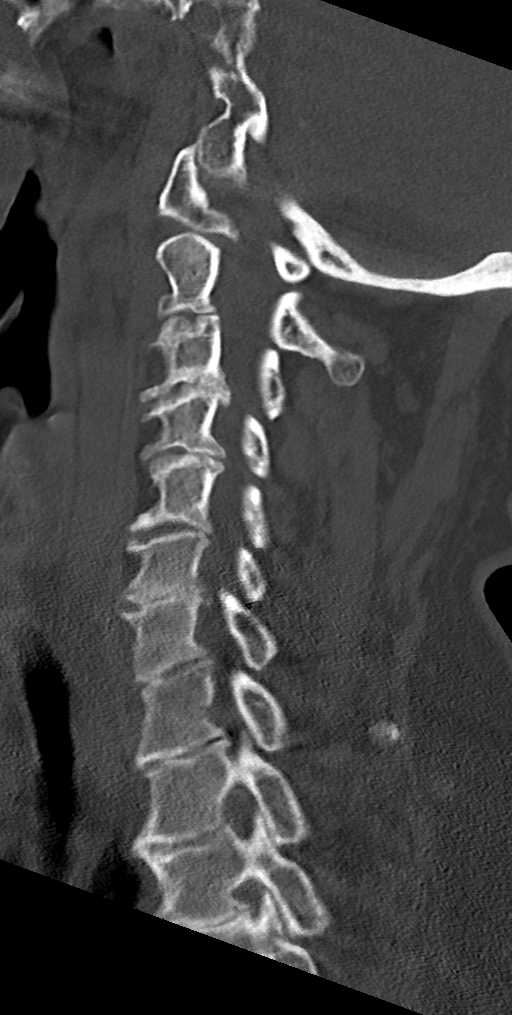
[im 49/61  bone]
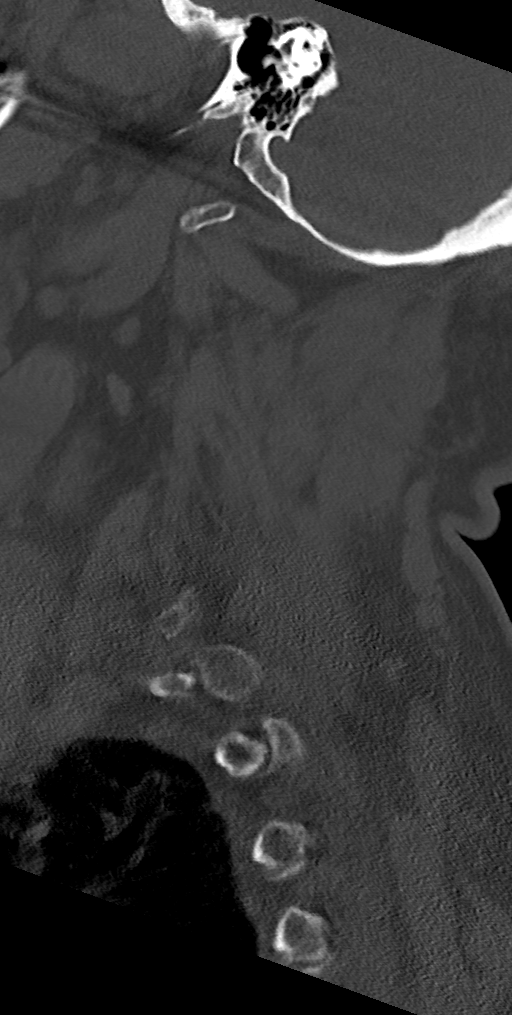

[Series 13: c_spine 2.0 cor bone · coronal · 0.30mm/px · 2 of 61 slices shown]
[im 21/61  bone]
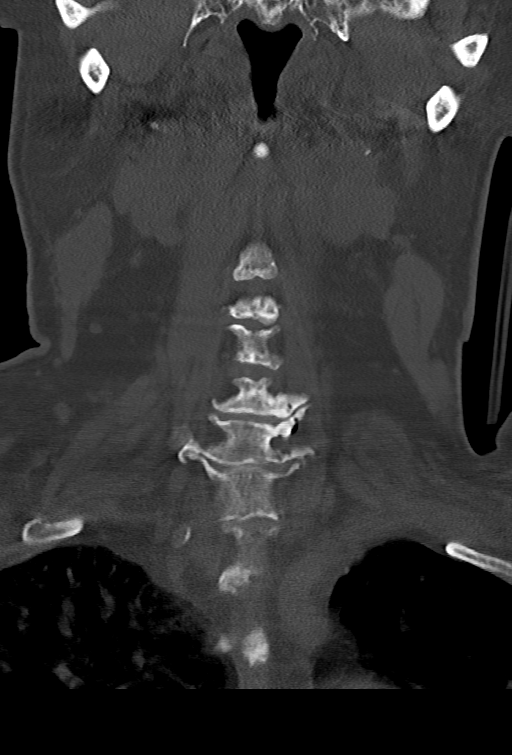
[im 41/61  bone]
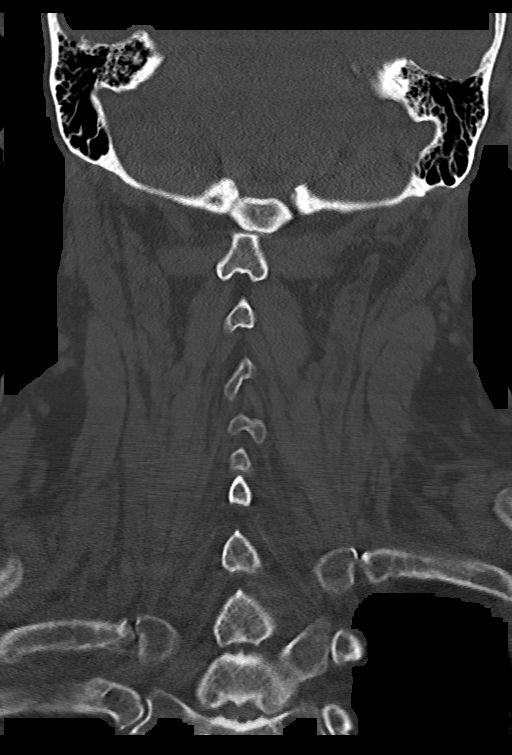

[Series 14: c_spine 2.0 orthogonals · axial · 0.21mm/px · z∈[-260,-200]mm · 2 of 97 slices shown]
[im 33/97  bone]
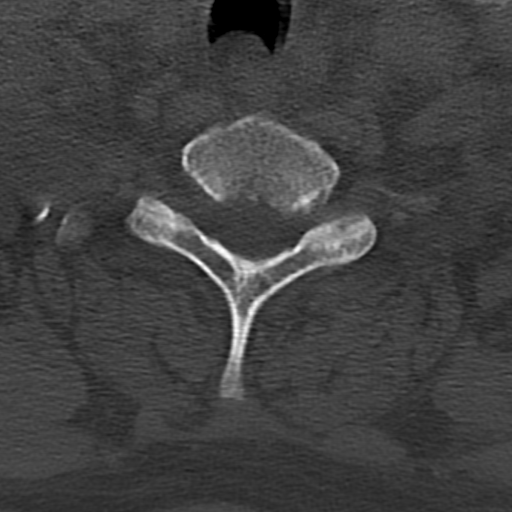
[im 65/97  bone]
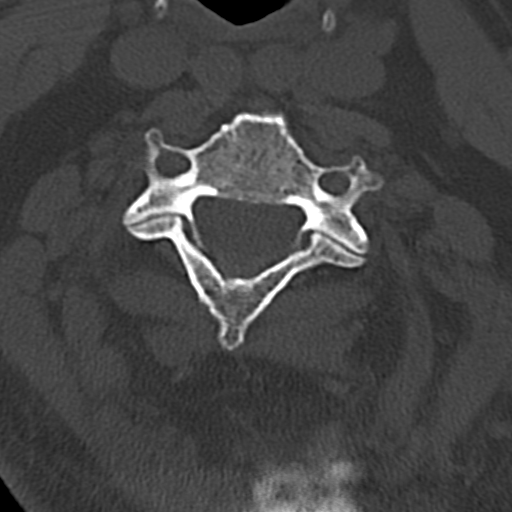

[12 of 33 positions shown; findings below may reference images not displayed]

FINDINGS: CT HEAD FINDINGS

Brain: No acute intracranial abnormality. Specifically, no
hemorrhage, hydrocephalus, mass lesion, acute infarction, or
significant intracranial injury.

Vascular: No hyperdense vessel or unexpected calcification.

Skull: No acute calvarial abnormality.

Sinuses/Orbits: Visualized paranasal sinuses and mastoids clear.
Orbital soft tissues unremarkable.

Other: None

CT CERVICAL SPINE FINDINGS

Alignment: Normal alignment.

Skull base and vertebrae: No acute fracture. No primary bone lesion
or focal pathologic process.

Soft tissues and spinal canal: No prevertebral fluid or swelling. No
visible canal hematoma.

Disc levels: Diffuse degenerative disc disease with disc space
narrowing and spurring. Mild diffuse degenerative facet disease,
left greater than right.

Upper chest: No acute findings

Other: No acute findings
IMPRESSION: No acute intracranial abnormality.

Degenerative changes in the cervical spine. No acute bony
abnormality.

## 2020-03-09 ENCOUNTER — Encounter (HOSPITAL_BASED_OUTPATIENT_CLINIC_OR_DEPARTMENT_OTHER): Payer: Self-pay

## 2020-05-02 ENCOUNTER — Telehealth (HOSPITAL_BASED_OUTPATIENT_CLINIC_OR_DEPARTMENT_OTHER): Payer: Medicare Other | Admitting: Physical Medicine & Rehabilitation

## 2021-07-17 LAB — OTHER LAB ORDER
Hemoglobin A1C: 5.9 % (ref 4.0–6.0)
Mean Blood Glucose Estimate: 123 mg/dL (ref 65–125)

## 2022-06-19 LAB — OTHER LAB ORDER
Hemoglobin A1C: 6 % (ref 4.0–6.0)
Mean Blood Glucose Estimate: 126 mg/dL — ABNORMAL HIGH (ref 65–125)

## 6496-10-11 DEATH — deceased
